# Patient Record
Sex: Male | Born: 1990 | Hispanic: No | Marital: Single | Smoking: Never smoker
Health system: Southern US, Community
[De-identification: ages and names within clinical notes are randomized; demographics above are authoritative.]

---

## 2013-11-12 ENCOUNTER — Encounter (HOSPITAL_COMMUNITY): Payer: Self-pay | Admitting: Emergency Medicine

## 2013-11-12 ENCOUNTER — Emergency Department (HOSPITAL_COMMUNITY)
Admission: EM | Admit: 2013-11-12 | Discharge: 2013-11-12 | Disposition: A | Discharge status: Home / Self Care | Payer: Self-pay | Attending: Emergency Medicine | Admitting: Emergency Medicine

## 2013-11-12 DIAGNOSIS — R112 Nausea with vomiting, unspecified: Secondary | ICD-10-CM | POA: Insufficient documentation

## 2013-11-12 DIAGNOSIS — R1110 Vomiting, unspecified: Secondary | ICD-10-CM

## 2013-11-12 DIAGNOSIS — R1013 Epigastric pain: Secondary | ICD-10-CM | POA: Insufficient documentation

## 2013-11-12 LAB — URINALYSIS, ROUTINE W REFLEX MICROSCOPIC
Bilirubin Urine: NEGATIVE
Glucose, UA: NEGATIVE mg/dL
Hgb urine dipstick: NEGATIVE
Ketones, ur: 40 mg/dL — AB
Leukocytes, UA: NEGATIVE
Nitrite: NEGATIVE
Protein, ur: NEGATIVE mg/dL
Specific Gravity, Urine: 1.025 (ref 1.005–1.030)
Urobilinogen, UA: 0.2 mg/dL (ref 0.0–1.0)
pH: 7.5 (ref 5.0–8.0)

## 2013-11-12 LAB — COMPREHENSIVE METABOLIC PANEL WITH GFR
ALT: 38 U/L (ref 0–53)
AST: 26 U/L (ref 0–37)
Albumin: 4.5 g/dL (ref 3.5–5.2)
Alkaline Phosphatase: 85 U/L (ref 39–117)
BUN: 9 mg/dL (ref 6–23)
CO2: 21 meq/L (ref 19–32)
Calcium: 9.5 mg/dL (ref 8.4–10.5)
Chloride: 100 meq/L (ref 96–112)
Creatinine, Ser: 0.75 mg/dL (ref 0.50–1.35)
GFR calc Af Amer: >90 mL/min
GFR calc non Af Amer: >90 mL/min
Glucose, Bld: 114 mg/dL — ABNORMAL HIGH (ref 70–99)
Potassium: 3.6 meq/L — ABNORMAL LOW (ref 3.7–5.3)
Sodium: 138 meq/L (ref 137–147)
Total Bilirubin: 0.7 mg/dL (ref 0.3–1.2)
Total Protein: 7.6 g/dL (ref 6.0–8.3)

## 2013-11-12 LAB — CBC WITH DIFFERENTIAL/PLATELET
Basophils Absolute: 0 K/uL (ref 0.0–0.1)
Basophils Relative: 0 % (ref 0–1)
Eosinophils Absolute: 0.1 K/uL (ref 0.0–0.7)
Eosinophils Relative: 0 % (ref 0–5)
HCT: 44.5 % (ref 39.0–52.0)
Hemoglobin: 16.2 g/dL (ref 13.0–17.0)
Lymphocytes Relative: 7 % — ABNORMAL LOW (ref 12–46)
Lymphs Abs: 1.2 K/uL (ref 0.7–4.0)
MCH: 31.5 pg (ref 26.0–34.0)
MCHC: 36.4 g/dL — ABNORMAL HIGH (ref 30.0–36.0)
MCV: 86.6 fL (ref 78.0–100.0)
Monocytes Absolute: 0.8 K/uL (ref 0.1–1.0)
Monocytes Relative: 5 % (ref 3–12)
Neutro Abs: 14.3 K/uL — ABNORMAL HIGH (ref 1.7–7.7)
Neutrophils Relative %: 88 % — ABNORMAL HIGH (ref 43–77)
Platelets: 234 K/uL (ref 150–400)
RBC: 5.14 MIL/uL (ref 4.22–5.81)
RDW: 13.4 % (ref 11.5–15.5)
WBC: 16.3 K/uL — ABNORMAL HIGH (ref 4.0–10.5)

## 2013-11-12 LAB — LIPASE, BLOOD: LIPASE: 20 U/L (ref 11–59)

## 2013-11-12 MED ORDER — ONDANSETRON 4 MG PO TBDP: 4.0000 mg | ORAL_TABLET | Freq: Three times a day (TID) | ORAL | Status: AC | PRN

## 2013-11-12 NOTE — ED Provider Notes (Signed)
CSN: [id number]     Arrival date & time 11/12/13  1444 History   First MD Initiated Contact with Patient 11/12/13 1715     Chief Complaint  Patient presents with  . Abdominal Pain  . Emesis     (Consider location/radiation/quality/duration/timing/severity/associated sxs/prior Treatment) HPI Comments: Patient with no past surgical history -- presents with complaint of epigastric abdominal pain and vomiting. Pain began acutely at approximately 6 AM today associated with vomiting. Patient vomited 3 or 4 times. Vomiting is nonbloody, nonbilious. Pain did not radiate. Patient took Pepto-Bismol without relief. He denies fever, diarrhea, urinary symptoms. Upon arrival to the emergency department, the pain resolved. He is currently comfortable. Patient denies heavy alcohol or NSAID use. Aggravating factors: none. Alleviating factors: none.    Patient is a 23 y.o. male presenting with abdominal pain and vomiting. The history is provided by the patient. A language interpreter was used.  Abdominal Pain Associated symptoms: nausea and vomiting   Associated symptoms: no chest pain, no cough, no diarrhea, no dysuria, no fever and no sore throat   Emesis Associated symptoms: abdominal pain   Associated symptoms: no diarrhea, no headaches, no myalgias and no sore throat     History reviewed. No pertinent past medical history. History reviewed. No pertinent past surgical history. No family history on file. History  Substance Use Topics  . Smoking status: Never Smoker   . Smokeless tobacco: Not on file  . Alcohol Use: Yes     Comment: occ    Review of Systems  Constitutional: Negative for fever.  HENT: Negative for rhinorrhea and sore throat.   Eyes: Negative for redness.  Respiratory: Negative for cough.   Cardiovascular: Negative for chest pain.  Gastrointestinal: Positive for nausea, vomiting and abdominal pain. Negative for diarrhea and blood in stool.  Genitourinary: Negative for dysuria.   Musculoskeletal: Negative for myalgias.  Skin: Negative for rash.  Neurological: Negative for headaches.      Allergies  Review of patient's allergies indicates no known allergies.  Home Medications   Prior to Admission medications   Not on File   BP 122/69  Pulse 80  Temp(Src) 98.5 F (36.9 C) (Oral)  Resp 20  SpO2 97%  Physical Exam  Nursing note and vitals reviewed. Constitutional: He appears well-developed and well-nourished.  HENT:  Head: Normocephalic and atraumatic.  Eyes: Conjunctivae are normal. Right eye exhibits no discharge. Left eye exhibits no discharge.  Neck: Normal range of motion. Neck supple.  Cardiovascular: Normal rate, regular rhythm and normal heart sounds.   Pulmonary/Chest: Effort normal and breath sounds normal.  Abdominal: Soft. Bowel sounds are normal. There is no tenderness. There is no rebound and no guarding.  Neurological: He is alert.  Skin: Skin is warm and dry.  Psychiatric: He has a normal mood and affect.    ED Course  Procedures (including critical care time) Labs Review Labs Reviewed  CBC WITH DIFFERENTIAL - Abnormal; Notable for the following:    WBC 16.3 (*)    MCHC 36.4 (*)    Neutrophils Relative % 88 (*)    Neutro Abs 14.3 (*)    Lymphocytes Relative 7 (*)    All other components within normal limits  COMPREHENSIVE METABOLIC PANEL - Abnormal; Notable for the following:    Potassium 3.6 (*)    Glucose, Bld 114 (*)    All other components within normal limits  LIPASE, BLOOD  URINALYSIS, ROUTINE W REFLEX MICROSCOPIC    Imaging Review No results found.  EKG Interpretation   Date/Time:  Tuesday November 12 2013 14:49:36 EDT Ventricular Rate:  79 PR Interval:  134 QRS Duration: 110 QT Interval:  388 QTC Calculation: 444 R Axis:   72 Text Interpretation:  Normal sinus rhythm Incomplete right bundle branch  block Borderline ECG      Patient seen and examined. Work-up reviewed.  Vital signs reviewed and are  as follows: Filed Vitals:   11/12/13 1700  BP: 122/69  Pulse: 80  Temp:   Resp: 20   Patient informed of all results. Pain is resolved. No active vomiting.  Patient to be discharged home with close monitoring. Zofran for nausea.  The patient was urged to return to the Emergency Department immediately with worsening of current symptoms, worsening abdominal pain, persistent vomiting, blood noted in stools, fever, or any other concerns. The patient verbalized understanding.   MDM   Final diagnoses:  Epigastric abdominal pain  Vomiting   Patient with epigastric pain and vomiting. Pain remained in the upper abdomen. Symptoms resolve spontaneously patient has not been very well. Abdomen exam is soft and nontender. No guarding or signs of peritonitis. Vital signs within normal limits. Elevated white blood cell count but no indication for imaging given current exam and history. Do not suspect appendicitis, perforated viscus. No fever. Patient likely with gastritis. Tolerating PO's. Lungs are clear. No focal abdominal pain, no concern for appendicitis, cholecystitis, pancreatitis, ruptured viscus, UTI, kidney stone, or any other abdominal etiology. Supportive therapy indicated with return if symptoms worsen. Patient counseled.     Renne CriglerJoshua Ivie Savitt, PA-C 11/12/13 [phone]

## 2013-11-12 NOTE — Discharge Instructions (Signed)
Please read and follow all provided instructions.  Your diagnoses today include:  1. Epigastric abdominal pain   2. Vomiting     Tests performed today include:  Blood counts and electrolytes - high white blood cell count  Blood tests to check liver and kidney function - normal  Blood tests to check pancreas function - normal  Urine test to look for infection - no infection  Vital signs. See below for your results today.   Medications prescribed:   Zofran (ondansetron) - for nausea and vomiting  Take any prescribed medications only as directed.  Home care instructions:   Follow any educational materials contained in this packet.  Follow-up instructions: Please follow-up with your primary care provider in the next 2 days for further evaluation of your symptoms. If you do not have a primary care doctor -- see below for referral information.   Return instructions:  SEEK IMMEDIATE MEDICAL ATTENTION IF:  The pain does not go away or becomes severe   A temperature above 101F develops   Repeated vomiting occurs (multiple episodes)   The pain becomes localized to portions of the abdomen. The right side could possibly be appendicitis. In an adult, the left lower portion of the abdomen could be colitis or diverticulitis.   Blood is being passed in stools or vomit (bright red or black tarry stools)   You develop chest pain, difficulty breathing, dizziness or fainting, or become confused, poorly responsive, or inconsolable (young children)  If you have any other emergent concerns regarding your health  Additional Information: Abdominal (belly) pain can be caused by many things. Your caregiver performed an examination and possibly ordered blood/urine tests and imaging (CT scan, x-rays, ultrasound). Many cases can be observed and treated at home after initial evaluation in the emergency department. Even though you are being discharged home, abdominal pain can be unpredictable.  Therefore, you need a repeated exam if your pain does not resolve, returns, or worsens. Most patients with abdominal pain don't have to be admitted to the hospital or have surgery, but serious problems like appendicitis and gallbladder attacks can start out as nonspecific pain. Many abdominal conditions cannot be diagnosed in one visit, so follow-up evaluations are very important.  Your vital signs today were: BP 122/69   Pulse 80   Temp(Src) 98.5 F (36.9 C) (Oral)   Resp 20   SpO2 97% If your blood pressure (bp) was elevated above 135/85 this visit, please have this repeated by your doctor within one month. --------------

## 2013-11-12 NOTE — ED Notes (Signed)
Pt is here with mid epigastric pain that started this am and he has been vomiting all morning.

## 2013-11-12 NOTE — ED Provider Notes (Signed)
Medical screening examination/treatment/procedure(s) were performed by non-physician practitioner and as supervising physician I was immediately available for consultation/collaboration.  Shahmeer Bunn L Morris Markham, MD 11/12/13 2008 

## 2016-02-18 ENCOUNTER — Emergency Department: Payer: No Typology Code available for payment source

## 2016-02-18 ENCOUNTER — Emergency Department
Admission: EM | Admit: 2016-02-18 | Discharge: 2016-02-18 | Disposition: A | Discharge status: Home / Self Care | Payer: No Typology Code available for payment source | Attending: Emergency Medicine | Admitting: Emergency Medicine

## 2016-02-18 ENCOUNTER — Encounter: Payer: Self-pay | Admitting: Emergency Medicine

## 2016-02-18 DIAGNOSIS — S0083XA Contusion of other part of head, initial encounter: Secondary | ICD-10-CM

## 2016-02-18 DIAGNOSIS — R52 Pain, unspecified: Secondary | ICD-10-CM

## 2016-02-18 DIAGNOSIS — Y92410 Unspecified street and highway as the place of occurrence of the external cause: Secondary | ICD-10-CM | POA: Insufficient documentation

## 2016-02-18 DIAGNOSIS — R51 Headache: Secondary | ICD-10-CM | POA: Diagnosis present

## 2016-02-18 DIAGNOSIS — Y999 Unspecified external cause status: Secondary | ICD-10-CM | POA: Diagnosis not present

## 2016-02-18 DIAGNOSIS — Y939 Activity, unspecified: Secondary | ICD-10-CM | POA: Insufficient documentation

## 2016-02-18 MED ORDER — HYDROCODONE-ACETAMINOPHEN 5-325 MG PO TABS: 1.0000 | ORAL_TABLET | ORAL | Status: AC | PRN
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
